# Patient Record
Sex: Male | Born: 2000 | Hispanic: No | Marital: Single | Smoking: Never smoker
Health system: Southern US, Community
[De-identification: ages and names within clinical notes are randomized; demographics above are authoritative.]

## PROBLEM LIST (undated history)

## (undated) DIAGNOSIS — J45909 Unspecified asthma, uncomplicated: Secondary | ICD-10-CM

---
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
## 2018-08-20 IMAGING — CR DG CHEST 2V
2 series · 2 of 2 positions shown · non-contrast
Comparison: None.

CLINICAL DATA: Acute onset of fever and productive cough. Initial
encounter.

EXAM:
CHEST  2 VIEW

[chest pa]
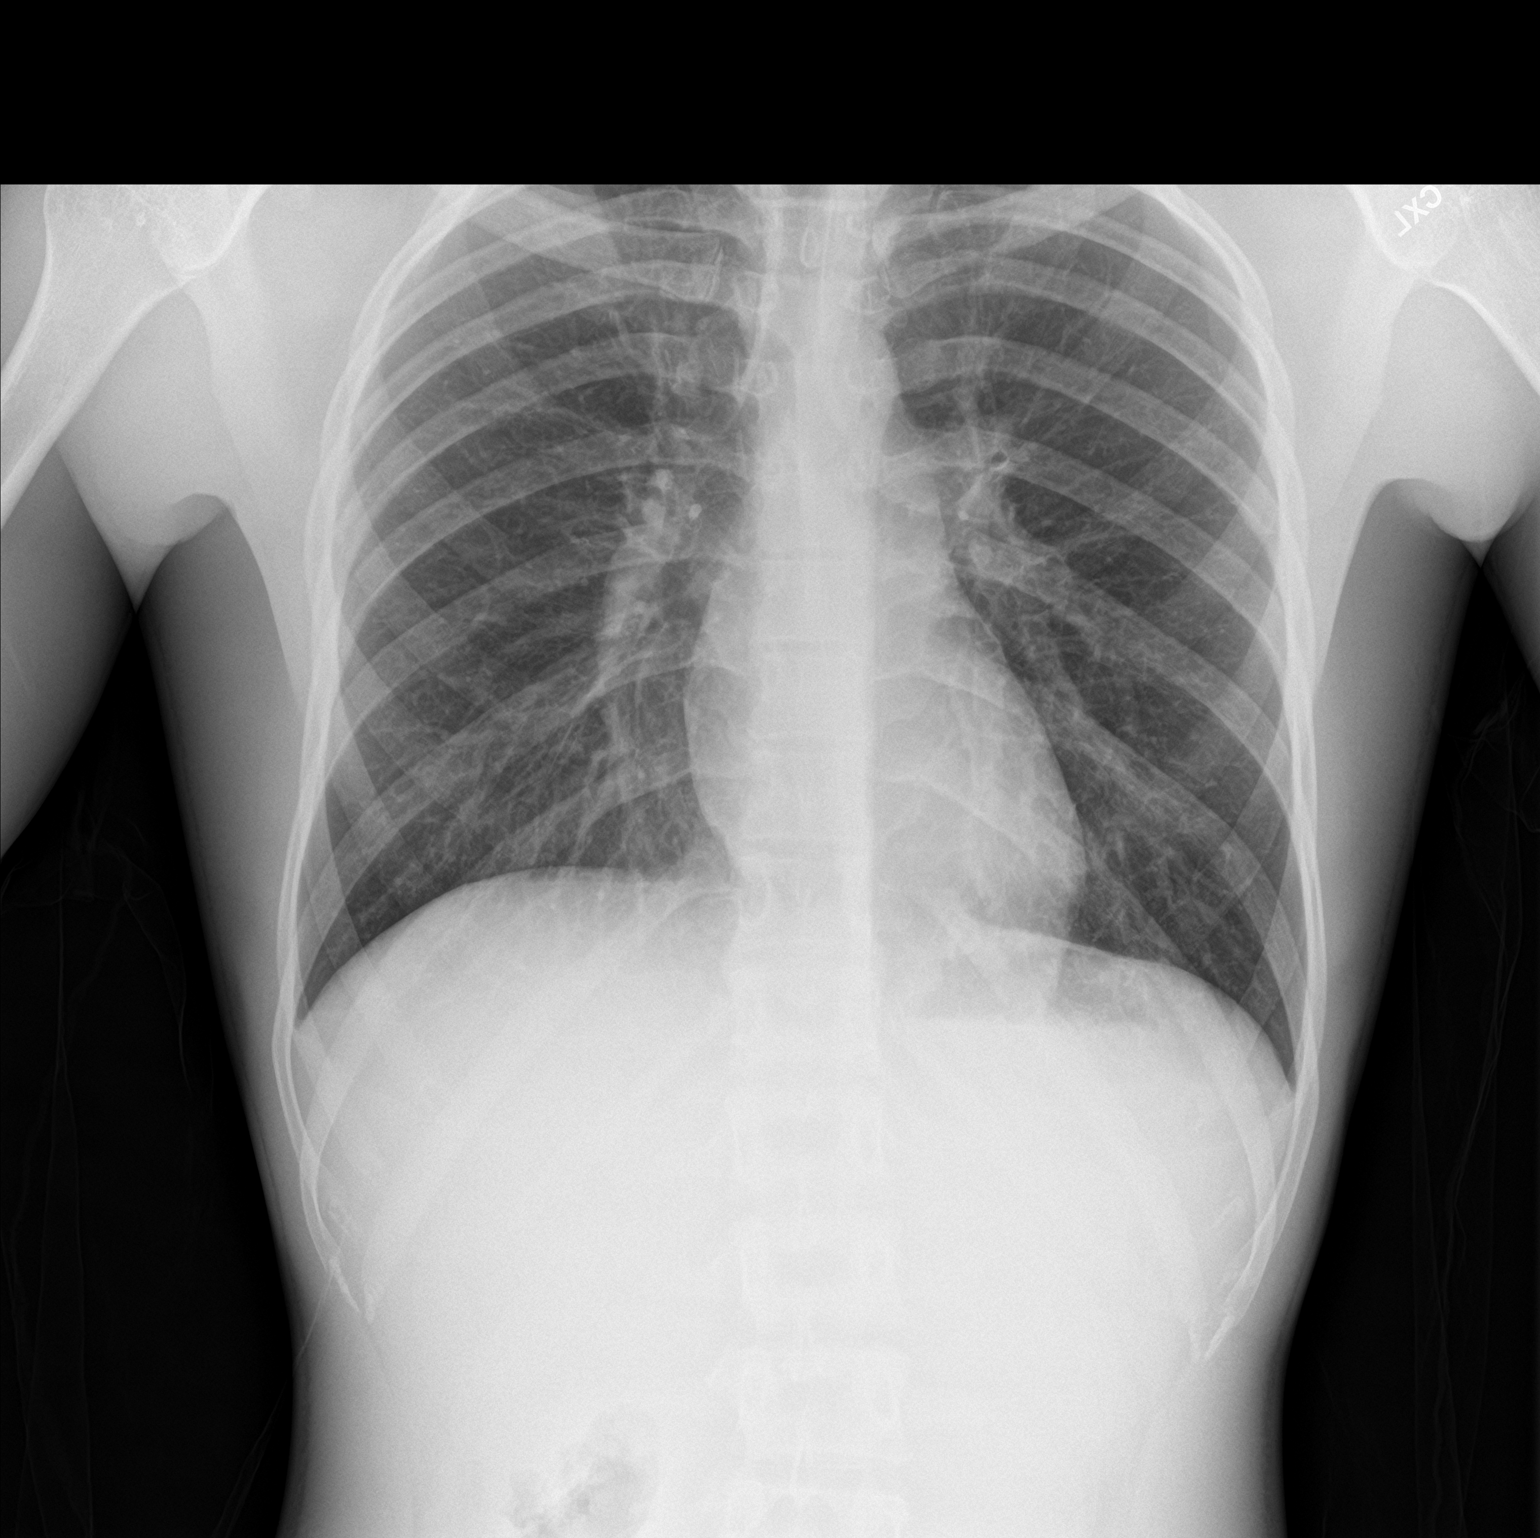

[chest lat]
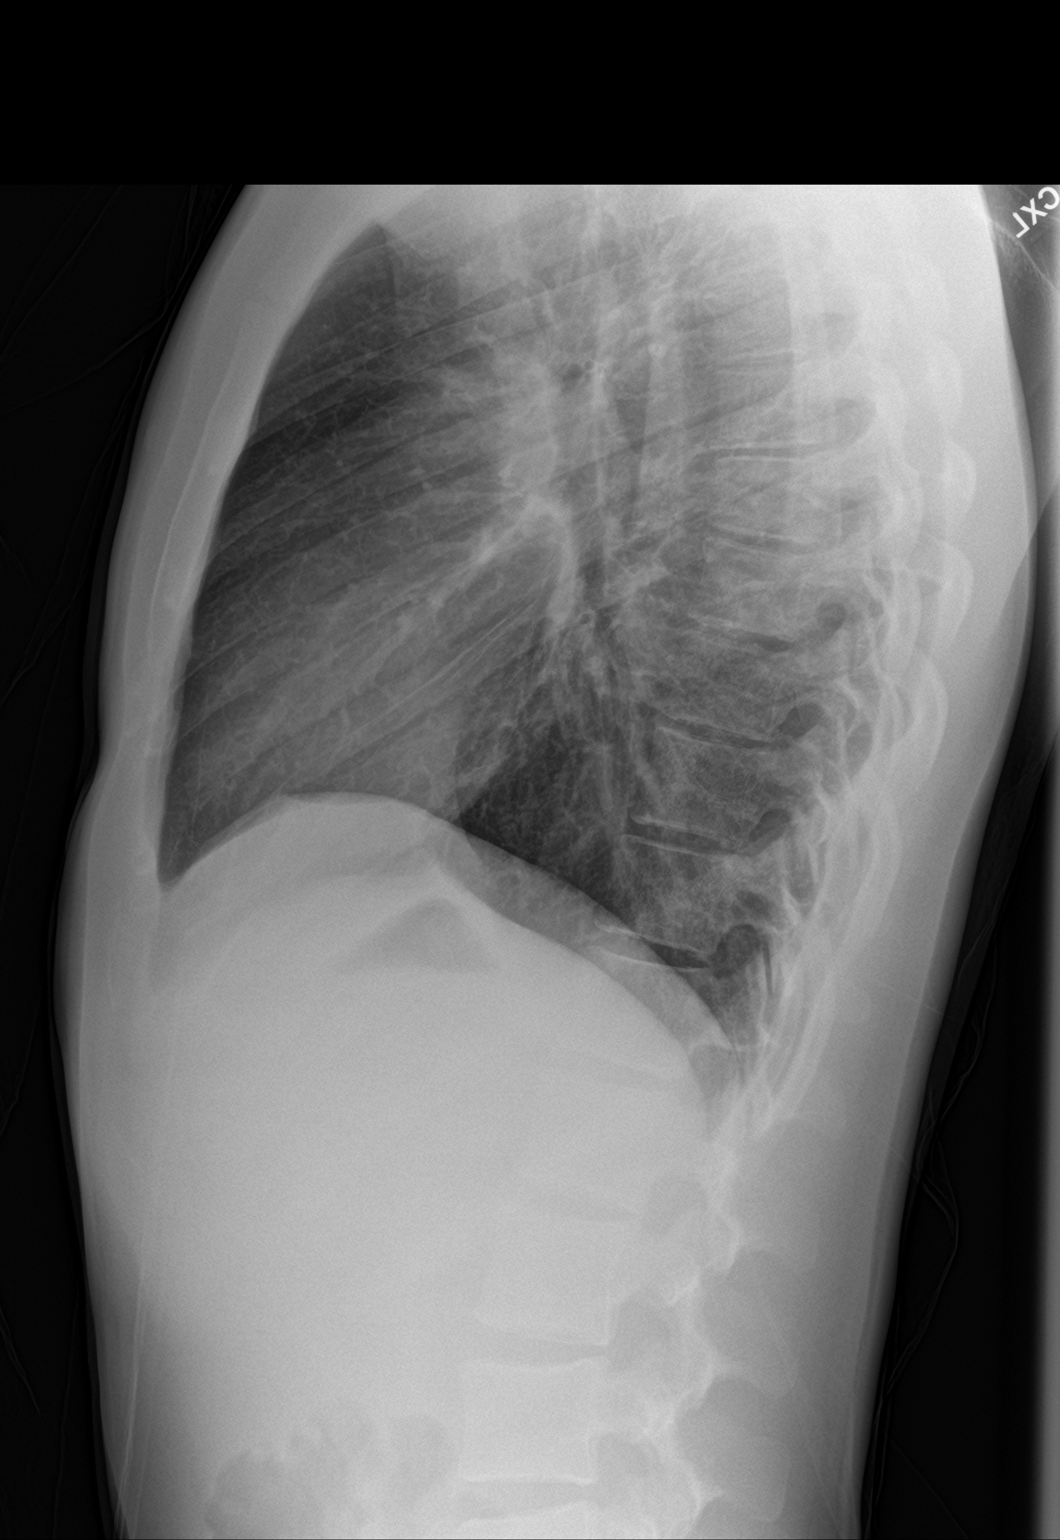

[2 of 2 positions shown; findings below may reference images not displayed]

FINDINGS: The lungs are well-aerated and clear. There is no evidence of focal
opacification, pleural effusion or pneumothorax.

The heart is normal in size; the mediastinal contour is within
normal limits. No acute osseous abnormalities are seen.
IMPRESSION: No acute cardiopulmonary process seen.

## 2022-09-14 ENCOUNTER — Ambulatory Visit (HOSPITAL_COMMUNITY)
Admission: EM | Admit: 2022-09-14 | Discharge: 2022-09-14 | Discharge status: Absent without leave | Payer: Medicaid Other

## 2022-09-15 ENCOUNTER — Encounter (HOSPITAL_COMMUNITY): Payer: Self-pay

## 2022-09-15 ENCOUNTER — Ambulatory Visit (HOSPITAL_COMMUNITY)
Admission: RE | Admit: 2022-09-15 | Discharge: 2022-09-15 | Disposition: A | Discharge status: Home / Self Care | Payer: Medicaid Other | Source: Ambulatory Visit | Attending: Emergency Medicine | Admitting: Emergency Medicine

## 2022-09-15 VITALS — BP 121/87 | HR 78 | Temp 98.0°F | Resp 12

## 2022-09-15 DIAGNOSIS — H9313 Tinnitus, bilateral: Secondary | ICD-10-CM

## 2022-09-15 DIAGNOSIS — H60503 Unspecified acute noninfective otitis externa, bilateral: Secondary | ICD-10-CM

## 2022-09-15 DIAGNOSIS — R198 Other specified symptoms and signs involving the digestive system and abdomen: Secondary | ICD-10-CM | POA: Diagnosis not present

## 2022-09-15 DIAGNOSIS — L299 Pruritus, unspecified: Secondary | ICD-10-CM | POA: Diagnosis not present

## 2022-09-15 MED ORDER — CIPROFLOXACIN-DEXAMETHASONE 0.3-0.1 % OT SUSP: 4.0000 [drp] | Freq: Two times a day (BID) | OTIC | 0 refills | Status: AC

## 2022-09-15 MED ORDER — LINACLOTIDE 72 MCG PO CAPS: 72.0000 ug | ORAL_CAPSULE | Freq: Every day | ORAL | 0 refills | Status: DC

## 2022-09-15 MED ORDER — CETIRIZINE HCL 10 MG PO TABS: 10.0000 mg | ORAL_TABLET | Freq: Every day | ORAL | 0 refills | Status: DC

## 2022-09-15 MED ORDER — PREDNISONE 20 MG PO TABS: 40.0000 mg | ORAL_TABLET | Freq: Every day | ORAL | 0 refills | Status: DC

## 2022-09-15 NOTE — ED Triage Notes (Signed)
Pt is skin has been irritated , ears has been popping

## 2022-09-15 NOTE — ED Provider Notes (Signed)
[facility]    CSN: [id number] Arrival date & time: 09/15/22  1047      History   Chief Complaint Chief Complaint  Patient presents with   Otalgia    HPI Jon Pham is a 22 y.o. male.  Patient presents complaining of generalized pruritus, bilateral ear discomfort, and straining with bowel movements that stared "months ago".  Patient reports the onset of symptoms began during him being incarcerated in jail, he states he is uncertain if exposure to " K2 drug smoke" in jail is part of the cause of his symptoms.  He reports that he was released from jail in November.  He states that he has random intermittent pruritus that is not relieved by anything and not exacerbated by anything.  He reports that he has used lotion for the pruritus with minimal relief of symptoms.  He reports that he has tried to use Q-tips to clean his ears with no relief of his ear discomfort.  He reports that he has had abnormal sounds in his ears, states that he will have " sounds of drops that are constant".  He reports that he is uncertain if it is due to his incarceration time in prison that he has continued issues with his ears.  He denies any trauma to his ears, ear drainage, fever, chills, nasal congestion, throat pain or any systemic symptoms.  He reports that he has had straining with bowel movements.  He reports that he believes that this was because due to the bread that he ate when he was in jail.  He reports that since leaving jail he has maintained a diverse diet, he states that he feels like since the food in jail was " non-halal for my religion" it may has caused the straining in his bowel movement. He reports that his last BM was today.  Denies history of gastrointestinal problems.   He appears anxious upon exam. Patient reports he's had some ongoing anxiety since leaving jail.   He states he's concerned about getting all his symptoms treated today.   History of Asthma and states he no  longer uses an inhaler for asthma.    Otalgia Associated symptoms: no abdominal pain, no congestion, no diarrhea, no ear discharge, no fever, no hearing loss, no rhinorrhea, no sore throat, no tinnitus and no vomiting     Past Medical History:  Diagnosis Date   Asthma     There are no problems to display for this patient.   History reviewed. No pertinent surgical history.     Home Medications    Prior to Admission medications   Medication Sig Start Date End Date Taking? Authorizing Provider  cetirizine (ZYRTEC ALLERGY) 10 MG tablet Take 1 tablet (10 mg total) by mouth daily. 09/15/22  Yes Debby Freiberg, NP  ciprofloxacin-dexamethasone (CIPRODEX) OTIC suspension Place 4 drops into both ears 2 (two) times daily for 5 days. 09/15/22 09/20/22 Yes Debby Freiberg, NP  linaclotide Karlene Einstein) 72 MCG capsule Take 1 capsule (72 mcg total) by mouth daily before breakfast. 09/15/22  Yes Debby Freiberg, NP  predniSONE (DELTASONE) 20 MG tablet Take 2 tablets (40 mg total) by mouth daily. 09/15/22  Yes Debby Freiberg, NP    Family History Family History  Problem Relation Age of Onset   Cancer Other     Social History Social History   Tobacco Use   Smoking status: Never   Smokeless tobacco: Never  Substance Use Topics   Alcohol  use: No   Drug use: No     Allergies   Patient has no known allergies.   Review of Systems Review of Systems  Constitutional:  Negative for activity change, appetite change, chills, fatigue, fever and unexpected weight change.  HENT:  Positive for ear pain. Negative for congestion, dental problem, ear discharge, hearing loss, nosebleeds, postnasal drip, rhinorrhea, sinus pressure, sinus pain, sneezing, sore throat, tinnitus, trouble swallowing and voice change.   Eyes: Negative.   Respiratory: Negative.  Negative for shortness of breath and wheezing.   Cardiovascular: Negative.  Negative for chest pain and palpitations.   Gastrointestinal:  Positive for constipation. Negative for abdominal distention, abdominal pain, blood in stool, diarrhea, nausea and vomiting.     Physical Exam Triage Vital Signs ED Triage Vitals  Enc Vitals Group     BP 09/15/22 1124 121/87     Pulse Rate 09/15/22 1124 78     Resp 09/15/22 1124 12     Temp 09/15/22 1124 98 F (36.7 C)     Temp Source 09/15/22 1124 Oral     SpO2 09/15/22 1124 96 %     Weight --      Height --      Head Circumference --      Peak Flow --      Pain Score 09/15/22 1131 6     Pain Loc --      Pain Edu? --      Excl. in [place]? --    No data found.  Updated Vital Signs BP 121/87 (BP Location: Left Arm)   Pulse 78   Temp 98 F (36.7 C) (Oral)   Resp 12   SpO2 96%     Physical Exam Vitals and nursing note reviewed.  HENT:     Right Ear: Hearing, tympanic membrane and external ear normal. Swelling (within ear canal) present.     Left Ear: Hearing, tympanic membrane and external ear normal. Swelling (within ear canal) present.  Cardiovascular:     Rate and Rhythm: Normal rate and regular rhythm.     Heart sounds: Normal heart sounds, S1 normal and S2 normal.  Pulmonary:     Effort: Pulmonary effort is normal.     Breath sounds: Normal breath sounds and air entry. No decreased breath sounds, wheezing, rhonchi or rales.  Abdominal:     General: Abdomen is flat. Bowel sounds are normal.     Palpations: Abdomen is soft.     Tenderness: There is abdominal tenderness. There is no right CVA tenderness, left CVA tenderness, guarding or rebound. Negative signs include Murphy's sign and McBurney's sign.  Skin:    General: Skin is warm.     Capillary Refill: Capillary refill takes less than 2 seconds.     Findings: No bruising, ecchymosis, erythema or rash.     Comments: Skin on unclothed areas appear normal.   Neurological:     Mental Status: He is alert.  Psychiatric:        Attention and Perception: Attention and perception normal.         Mood and Affect: Mood is anxious. Affect is labile.        Speech: Speech normal.        Behavior: Behavior normal. Behavior is cooperative.        Thought Content: Thought content normal. Thought content does not include homicidal or suicidal ideation.        Cognition and Memory: Cognition normal.  Judgment: Judgment normal.      UC Treatments / Results  Labs (all labs ordered are listed, but only abnormal results are displayed) Labs Reviewed - No data to display  EKG   Radiology No results found.  Procedures Procedures (including critical care time)  Medications Ordered in UC Medications - No data to display  Initial Impression / Assessment and Plan / UC Course  I have reviewed the triage vital signs and the nursing notes.  Pertinent labs & imaging results that were available during my care of the patient were reviewed by me and considered in my medical decision making (see chart for details).     Patient was evaluated for pruritus.  Uncertain etiology of symptoms.  Prednisone and Zyrtec sent to the pharmacy, patient was educated on treatment regiment.  Patient was made aware of timeline for symptom resolution.   Patient was evaluated acute otitis externa of and ear noise of both ears.  Ciprodex eardrops was sent to the pharmacy.  Patient was made aware of medication regiment.  Patient was made aware that he will need to follow-up with his PCP if symptoms do not improve.  Patient was evaluated for straining during bowel movements.  Based on physical assessment and patient statement, no concern for any emergent gastrointestinal problem.  Patient was educated on increasing fluid intake and maintaining a high-fiber diet.  Linzess prescription was sent to the pharmacy, patient was made aware of treatment regiment.  Patient was made aware that he will need to follow-up with his PCP.   Based on physical assessment, patient appears to have a potential psych component to his  symptomology.  Patient was given information for the full [facility]  Patient was encouraged to follow-up with this clinic if anxiety symptoms are not improving.  Patient verbalized understanding of instructions.  Charting was provided using a a verbal dictation system, charting was proofread for errors, errors may occur which could change the meaning of the information charted.   Final Clinical Impressions(s) / UC Diagnoses   Final diagnoses:  Pruritus  Acute otitis externa of both ears, unspecified type  Straining during bowel movements     Discharge Instructions      Prednisone is a steroid, this medication has been sent to the pharmacy, you will take 2 tablets for the next 5 mornings, this medication is for the itching.  Ciprodex ear drops has been sent to the pharmacy, will place 2 drops in each ear 2 times daily for the next 5 days, this is for the ear discomfort.  Zyrtec has been sent to the pharmacy, this is an oral antihistamine, take 1 tablet daily, this medication can help with the itching and possibly with the ear discomfort. Linzess has been sent to the pharmacy, you will take 1 tablet at breakfast time, this medication is to help your bowel movements be regular.  I have attached information for your primary care provider, you will need to call and schedule an appointment for follow-up.  I have also attached information for behavioral health urgent care, you may follow-up with this clinic if you feel like you continue to have anxiety issues or feel as though you want a psych evaluation.  If you develop any severe/worsening symptoms please go to the nearest emergency department for further evaluation.       ED Prescriptions     Medication Sig Dispense Auth. Provider   predniSONE (DELTASONE) 20 MG tablet Take 2 tablets (40 mg total) by  mouth daily. 10 tablet Debby Freiberg, NP   ciprofloxacin-dexamethasone (CIPRODEX) OTIC suspension Place 4  drops into both ears 2 (two) times daily for 5 days. 7.5 mL Debby Freiberg, NP   linaclotide (LINZESS) 72 MCG capsule Take 1 capsule (72 mcg total) by mouth daily before breakfast. 15 capsule Debby Freiberg, NP   cetirizine (ZYRTEC ALLERGY) 10 MG tablet Take 1 tablet (10 mg total) by mouth daily. 30 tablet Debby Freiberg, NP      PDMP not reviewed this encounter.   Debby Freiberg, NP 09/15/22 1253

## 2022-09-15 NOTE — Discharge Instructions (Addendum)
Prednisone is a steroid, this medication has been sent to the pharmacy, you will take 2 tablets for the next 5 mornings, this medication is for the itching.  Ciprodex ear drops has been sent to the pharmacy, will place 2 drops in each ear 2 times daily for the next 5 days, this is for the ear discomfort.  Zyrtec has been sent to the pharmacy, this is an oral antihistamine, take 1 tablet daily, this medication can help with the itching and possibly with the ear discomfort. Linzess has been sent to the pharmacy, you will take 1 tablet at breakfast time, this medication is to help your bowel movements be regular.  I have attached information for your primary care provider, you will need to call and schedule an appointment for follow-up.  I have also attached information for behavioral health urgent care, you may follow-up with this clinic if you feel like you continue to have anxiety issues or feel as though you want a psych evaluation.  If you develop any severe/worsening symptoms please go to the nearest emergency department for further evaluation.

## 2022-12-10 ENCOUNTER — Encounter (HOSPITAL_COMMUNITY): Payer: Self-pay | Admitting: Emergency Medicine

## 2022-12-10 ENCOUNTER — Ambulatory Visit (HOSPITAL_COMMUNITY)
Admission: EM | Admit: 2022-12-10 | Discharge: 2022-12-10 | Disposition: A | Discharge status: Home / Self Care | Payer: Medicaid Other | Attending: Family Medicine | Admitting: Family Medicine

## 2022-12-10 DIAGNOSIS — Z79899 Other long term (current) drug therapy: Secondary | ICD-10-CM | POA: Insufficient documentation

## 2022-12-10 DIAGNOSIS — Z1152 Encounter for screening for COVID-19: Secondary | ICD-10-CM | POA: Diagnosis not present

## 2022-12-10 DIAGNOSIS — J4521 Mild intermittent asthma with (acute) exacerbation: Secondary | ICD-10-CM | POA: Diagnosis present

## 2022-12-10 DIAGNOSIS — J069 Acute upper respiratory infection, unspecified: Secondary | ICD-10-CM | POA: Insufficient documentation

## 2022-12-10 DIAGNOSIS — J301 Allergic rhinitis due to pollen: Secondary | ICD-10-CM | POA: Diagnosis not present

## 2022-12-10 MED ORDER — PREDNISONE 20 MG PO TABS: 40.0000 mg | ORAL_TABLET | Freq: Every day | ORAL | 0 refills | Status: AC

## 2022-12-10 MED ORDER — PROMETHAZINE-DM 6.25-15 MG/5ML PO SYRP: 5.0000 mL | ORAL_SOLUTION | Freq: Four times a day (QID) | ORAL | 0 refills | Status: DC | PRN

## 2022-12-10 MED ORDER — ALBUTEROL SULFATE HFA 108 (90 BASE) MCG/ACT IN AERS: 2.0000 | INHALATION_SPRAY | RESPIRATORY_TRACT | 0 refills | Status: AC | PRN

## 2022-12-10 NOTE — ED Provider Notes (Signed)
[facility]    CSN: [id number] Arrival date & time: 12/10/22  1346      History   Chief Complaint Chief Complaint  Patient presents with   Nasal Congestion    HPI Jon Pham is a 22 y.o. male.   HPI Here for nasal congestion and cough.  He has had nasal congestion and postnasal drainage for the last 2 weeks, and he is felt for the most part that this was consistent with allergies.  On April 19 however he began feeling worse with more intense congestion and his throat is little sore from the postnasal drainage.  Also he has had some malaise and fatigue.  No vomiting or diarrhea.   he has developed some persistent cough though it is improved in the last 24 hours.  His may be feeling a little bit better today also he does have a history of asthma and when he went running yesterday he had some wheezing and tightness in his chest.   He requests Hydromet for his cough.  Past Medical History:  Diagnosis Date   Asthma     There are no problems to display for this patient.   History reviewed. No pertinent surgical history.     Home Medications    Prior to Admission medications   Medication Sig Start Date End Date Taking? Authorizing Provider  albuterol (VENTOLIN HFA) 108 (90 Base) MCG/ACT inhaler Inhale 2 puffs into the lungs every 4 (four) hours as needed for wheezing or shortness of breath. 12/10/22  Yes Zenia Resides, MD  predniSONE (DELTASONE) 20 MG tablet Take 2 tablets (40 mg total) by mouth daily with breakfast for 5 days. 12/10/22 12/15/22 Yes Zenia Resides, MD  promethazine-dextromethorphan (PROMETHAZINE-DM) 6.25-15 MG/5ML syrup Take 5 mLs by mouth 4 (four) times daily as needed for cough. 12/10/22  Yes Zenia Resides, MD    Family History Family History  Problem Relation Age of Onset   Cancer Other     Social History Social History   Tobacco Use   Smoking status: Never   Smokeless tobacco: Never  Substance Use Topics   Alcohol use:  No   Drug use: No     Allergies   Patient has no known allergies.   Review of Systems Review of Systems   Physical Exam Triage Vital Signs ED Triage Vitals  Enc Vitals Group     BP 12/10/22 1504 126/82     Pulse Rate 12/10/22 1504 94     Resp 12/10/22 1504 17     Temp 12/10/22 1504 98.2 F (36.8 C)     Temp Source 12/10/22 1504 Oral     SpO2 12/10/22 1504 97 %     Weight --      Height --      Head Circumference --      Peak Flow --      Pain Score 12/10/22 1503 0     Pain Loc --      Pain Edu? --      Excl. in [place]? --    No data found.  Updated Vital Signs BP 126/82 (BP Location: Left Arm)   Pulse 94   Temp 98.2 F (36.8 C) (Oral)   Resp 17   SpO2 97%   Visual Acuity Right Eye Distance:   Left Eye Distance:   Bilateral Distance:    Right Eye Near:   Left Eye Near:    Bilateral Near:     Physical Exam  Vitals reviewed.  Constitutional:      General: He is not in acute distress.    Appearance: He is not toxic-appearing.  HENT:     Right Ear: Tympanic membrane and ear canal normal.     Left Ear: Tympanic membrane and ear canal normal.     Nose: Congestion present.     Mouth/Throat:     Mouth: Mucous membranes are moist.     Comments: There is clear mucus draining in the oropharynx.  No erythema. Eyes:     Extraocular Movements: Extraocular movements intact.     Conjunctiva/sclera: Conjunctivae normal.     Pupils: Pupils are equal, round, and reactive to light.  Cardiovascular:     Rate and Rhythm: Normal rate and regular rhythm.     Heart sounds: No murmur heard. Pulmonary:     Effort: No respiratory distress.     Breath sounds: No stridor. No wheezing, rhonchi or rales.  Musculoskeletal:     Cervical back: Neck supple.  Lymphadenopathy:     Cervical: No cervical adenopathy.  Skin:    Capillary Refill: Capillary refill takes less than 2 seconds.     Coloration: Skin is not jaundiced or pale.  Neurological:     General: No focal deficit  present.     Mental Status: He is alert and oriented to person, place, and time.  Psychiatric:        Behavior: Behavior normal.      UC Treatments / Results  Labs (all labs ordered are listed, but only abnormal results are displayed) Labs Reviewed  SARS CORONAVIRUS 2 (TAT 6-24 HRS)    EKG   Radiology No results found.  Procedures Procedures (including critical care time)  Medications Ordered in UC Medications - No data to display  Initial Impression / Assessment and Plan / UC Course  I have reviewed the triage vital signs and the nursing notes.  Pertinent labs & imaging results that were available during my care of the patient were reviewed by me and considered in my medical decision making (see chart for details).        Albuterol and prednisone are sent in for the asthma exacerbation.  Discussed with him that Hydromet is not a cough syrup I usually use unless someone is not improving or if they have such a thing as asthma.  I was going to send in [facility], but he still request a "good" cough syrup.  Phenergan with dextromethorphan is therefore sent.  COVID swab was done.  If positive he will know if he needs to quarantine any further Final Clinical Impressions(s) / UC Diagnoses   Final diagnoses:  Mild intermittent asthma with acute exacerbation  Viral URI with cough  Seasonal allergic rhinitis due to pollen     Discharge Instructions      Albuterol inhaler--do 2 puffs every 4 hours as needed for shortness of breath or wheezing  Take prednisone 20 mg--2 daily for 5 days  Take Phenergan with dextromethorphan syrup--5 mL or 1 teaspoon every 6 hours as needed for cough   You have been swabbed for COVID, and the test will result in the next 24 hours. Our staff will call you if positive. If the COVID test is positive, you should quarantine until you are fever free for 24 hours and you are starting to feel better, and then take added precautions for the  next 5 days, such as physical distancing/wearing a mask and good hand hygiene/washing.  ED Prescriptions     Medication Sig Dispense Auth. Provider   albuterol (VENTOLIN HFA) 108 (90 Base) MCG/ACT inhaler Inhale 2 puffs into the lungs every 4 (four) hours as needed for wheezing or shortness of breath. 1 each Zenia Resides, MD   predniSONE (DELTASONE) 20 MG tablet Take 2 tablets (40 mg total) by mouth daily with breakfast for 5 days. 10 tablet Zenia Resides, MD   promethazine-dextromethorphan (PROMETHAZINE-DM) 6.25-15 MG/5ML syrup Take 5 mLs by mouth 4 (four) times daily as needed for cough. 118 mL Zenia Resides, MD      PDMP not reviewed this encounter.   Zenia Resides, MD 12/10/22 [phone]

## 2022-12-10 NOTE — ED Triage Notes (Signed)
Pt reports bad congestion due to allergies since Friday. Reports tried Theraflu and hot tea.

## 2022-12-10 NOTE — Discharge Instructions (Signed)
Albuterol inhaler--do 2 puffs every 4 hours as needed for shortness of breath or wheezing  Take prednisone 20 mg--2 daily for 5 days  Take Phenergan with dextromethorphan syrup--5 mL or 1 teaspoon every 6 hours as needed for cough   You have been swabbed for COVID, and the test will result in the next 24 hours. Our staff will call you if positive. If the COVID test is positive, you should quarantine until you are fever free for 24 hours and you are starting to feel better, and then take added precautions for the next 5 days, such as physical distancing/wearing a mask and good hand hygiene/washing.

## 2022-12-11 LAB — SARS CORONAVIRUS 2 (TAT 6-24 HRS): SARS Coronavirus 2: NEGATIVE

## 2023-01-04 ENCOUNTER — Emergency Department (HOSPITAL_COMMUNITY): Payer: Medicaid Other

## 2023-01-04 ENCOUNTER — Encounter (HOSPITAL_COMMUNITY): Payer: Self-pay

## 2023-01-04 ENCOUNTER — Other Ambulatory Visit: Payer: Self-pay

## 2023-01-04 ENCOUNTER — Emergency Department (HOSPITAL_COMMUNITY)
Admission: EM | Admit: 2023-01-04 | Discharge: 2023-01-04 | Disposition: A | Discharge status: Home / Self Care | Payer: Medicaid Other | Attending: Emergency Medicine | Admitting: Emergency Medicine

## 2023-01-04 ENCOUNTER — Ambulatory Visit (HOSPITAL_COMMUNITY)
Admission: EM | Admit: 2023-01-04 | Discharge: 2023-01-04 | Disposition: A | Discharge status: Home / Self Care | Payer: No Typology Code available for payment source

## 2023-01-04 DIAGNOSIS — S0990XA Unspecified injury of head, initial encounter: Secondary | ICD-10-CM

## 2023-01-04 DIAGNOSIS — S0101XA Laceration without foreign body of scalp, initial encounter: Secondary | ICD-10-CM

## 2023-01-04 DIAGNOSIS — W228XXA Striking against or struck by other objects, initial encounter: Secondary | ICD-10-CM | POA: Insufficient documentation

## 2023-01-04 MED ORDER — LIDOCAINE-EPINEPHRINE-TETRACAINE (LET) TOPICAL GEL
3.0000 mL | Freq: Once | TOPICAL | Status: DC
  Administered 2023-01-04: 3 mL via TOPICAL
  Filled 2023-01-04: qty 3

## 2023-01-04 MED ORDER — LIDOCAINE-EPINEPHRINE (PF) 2 %-1:200000 IJ SOLN
10.0000 mL | Freq: Once | INTRAMUSCULAR | Status: AC
  Administered 2023-01-04: 10 mL
  Filled 2023-01-04: qty 20

## 2023-01-04 NOTE — Discharge Instructions (Signed)
Note the workup today was overall reassuring.  CT study was without any abnormality.  Symptoms is likely secondary to concussive type episode.  Lacerations were repaired using staples.  You have 4 in place.  They need to be removed in 7 to 10 days at urgent care, primary care or in the emergency department.  Take Tylenol/Motrin as needed for pain.  Wash area gently with warm soapy water.  Avoid submersion in lakes, pools, baths, ocean.  Please not hesitate to return to emergency department for worrisome signs and symptoms we discussed become apparent.

## 2023-01-04 NOTE — ED Provider Notes (Signed)
[facility]    CSN: [id number] Arrival date & time: 01/04/23  1555      History   Chief Complaint Chief Complaint  Patient presents with   Head Injury    laceration    HPI Jon Pham is a 22 y.o. male.   Pleasant 22 year old male presents today due to concern of head injury.  He states roughly 3 hours ago he was running up stairs training, and somehow slipped hitting his head on 2 bleachers and a concrete stair.  Since the head injury, he states he has developed a slowed reaction time and feeling difficulty concentrating or remembering.  He also feels slightly emotional.  He denied losing any consciousness, and denies any nausea or vomiting.  He does not have a fever.  Initially he stated headache only to the location of the lacerations, but then states he is starting to develop a more severe headache which is also now including his frontal lobe. He sustained two lacerations to the R occipital region of his scalp. Pt does feel off balance and dizzy.    Head Injury   Past Medical History:  Diagnosis Date   Asthma     There are no problems to display for this patient.   History reviewed. No pertinent surgical history.     Home Medications    Prior to Admission medications   Medication Sig Start Date End Date Taking? Authorizing Provider  albuterol (VENTOLIN HFA) 108 (90 Base) MCG/ACT inhaler Inhale 2 puffs into the lungs every 4 (four) hours as needed for wheezing or shortness of breath. 12/10/22   Zenia Resides, MD    Family History Family History  Problem Relation Age of Onset   Cancer Other     Social History Social History   Tobacco Use   Smoking status: Never   Smokeless tobacco: Never  Substance Use Topics   Alcohol use: No   Drug use: No     Allergies   Patient has no known allergies.   Review of Systems Review of Systems As per HPI  Physical Exam Triage Vital Signs ED Triage Vitals  Enc Vitals Group     BP 01/04/23  1609 130/72     Pulse Rate 01/04/23 1609 89     Resp 01/04/23 1609 16     Temp 01/04/23 1609 98.3 F (36.8 C)     Temp Source 01/04/23 1609 Oral     SpO2 01/04/23 1609 97 %     Weight 01/04/23 1608 180 lb (81.6 kg)     Height 01/04/23 1608 5\' 9"  (1.753 m)     Head Circumference --      Peak Flow --      Pain Score 01/04/23 1609 3     Pain Loc --      Pain Edu? --      Excl. in GC? --    No data found.  Updated Vital Signs BP 130/72 (BP Location: Left Arm)   Pulse 89   Temp 98.3 F (36.8 C) (Oral)   Resp 16   Ht 5\' 9"  (1.753 m)   Wt 180 lb (81.6 kg)   SpO2 97%   BMI 26.58 kg/m   Visual Acuity Right Eye Distance:   Left Eye Distance:   Bilateral Distance:    Right Eye Near:   Left Eye Near:    Bilateral Near:     Physical Exam Vitals and nursing note reviewed.  Constitutional:  General: He is not in acute distress.    Appearance: Normal appearance. He is normal weight. He is not ill-appearing or toxic-appearing.  HENT:     Head: Normocephalic. Mass (hematoma surrounding lacs) and laceration present. No Battle's sign.      Right Ear: Tympanic membrane, ear canal and external ear normal. There is no impacted cerumen. No hemotympanum. Tympanic membrane is not injected, erythematous or retracted.     Left Ear: Tympanic membrane, ear canal and external ear normal. There is no impacted cerumen. No hemotympanum. Tympanic membrane is not injected, erythematous or retracted.     Nose: Nose normal. No congestion or rhinorrhea.     Mouth/Throat:     Mouth: Mucous membranes are moist.     Pharynx: Oropharynx is clear. No oropharyngeal exudate or posterior oropharyngeal erythema.  Eyes:     General: No visual field deficit or scleral icterus.       Right eye: No discharge.        Left eye: No discharge.     Extraocular Movements: Extraocular movements intact.     Conjunctiva/sclera: Conjunctivae normal.     Pupils: Pupils are equal, round, and reactive to light.   Cardiovascular:     Rate and Rhythm: Normal rate and regular rhythm.     Pulses: Normal pulses.     Heart sounds: No murmur heard. Pulmonary:     Effort: Pulmonary effort is normal. No respiratory distress.     Breath sounds: Normal breath sounds. No stridor. No rhonchi.  Chest:     Chest wall: No tenderness.  Musculoskeletal:        General: No swelling, tenderness, deformity or signs of injury. Normal range of motion.     Cervical back: Normal range of motion and neck supple. No rigidity or tenderness.     Right lower leg: No edema.  Lymphadenopathy:     Cervical: No cervical adenopathy.  Skin:    General: Skin is warm.     Coloration: Skin is not jaundiced.     Findings: No bruising, erythema, lesion or rash.  Neurological:     General: No focal deficit present.     Mental Status: He is alert and oriented to person, place, and time. Mental status is at baseline.     Cranial Nerves: Cranial nerves 2-12 are intact. No cranial nerve deficit, dysarthria or facial asymmetry.     Sensory: Sensation is intact. No sensory deficit.     Motor: Motor function is intact. No weakness, tremor, atrophy, abnormal muscle tone or pronator drift.     Coordination: Coordination is intact. Coordination normal.     Gait: Gait is intact.     Deep Tendon Reflexes: Reflexes are normal and symmetric. Reflexes normal.  Psychiatric:        Mood and Affect: Mood normal.        Behavior: Behavior normal.        Thought Content: Thought content normal.        Judgment: Judgment normal.      UC Treatments / Results  Labs (all labs ordered are listed, but only abnormal results are displayed) Labs Reviewed - No data to display  EKG   Radiology No results found.  Procedures Procedures (including critical care time)  Medications Ordered in UC Medications - No data to display  Initial Impression / Assessment and Plan / UC Course  I have reviewed the triage vital signs and the nursing  notes.  Pertinent labs & imaging  results that were available during my care of the patient were reviewed by me and considered in my medical decision making (see chart for details).     Injury to head -patient with head injury, and worsening headache upon evaluation.  Vital signs stable and neuroexam normal in office, however with worsening symptoms patient wanting to obtain CT scan.  Discussed with patient this would have to be done through the ER as we do not have capabilities of ordering stat CTs from urgent care.  Patient requesting to present to ER for further evaluation. Laceration of scalp without foreign body -2 scalp lacerations to right occipital region, will likely need staples.  Not performed in urgent care as to avoid any interference with possible imaging done in ER.    Final Clinical Impressions(s) / UC Diagnoses   Final diagnoses:  Injury of head, initial encounter  Laceration of scalp without foreign body, initial encounter     Discharge Instructions      Pt with head injury 3-3.5 hours ago. Two lacerations noted to occipital region of scalp on L. Pt filled out head injury form scoring: Moderate headache, mild balance problems, dizziness and fatigue, drowsiness, sensitivity to light and noise, nervousness and feeling more emotional.  Patient also notes numbness and tingling.  Since the injury patient reports moderate difficulty concentrating remembering and feeling slowed down. During our discussion, his headache became more severe, involving the entire head and not just the location of the laceration. Due to this, I feel CT scan may be indicated. Lacerations were not closed to ensure no interference with results of scan. Suspect after scan, staple closure will need to be obtained.  Pt to head to ER now for further evaluation of closed head injury.     ED Prescriptions   None    PDMP not reviewed this encounter.   Maretta Bees, [place] 01/04/23 1708

## 2023-01-04 NOTE — ED Notes (Signed)
Patient is being discharged from the Urgent Care and sent to the Emergency Department via self . Per Guy Sandifer, PA, patient is in need of higher level of care due to Head injury. Patient is aware and verbalizes understanding of plan of care.  Vitals:   01/04/23 1609  BP: 130/72  Pulse: 89  Resp: 16  Temp: 98.3 F (36.8 C)  SpO2: 97%

## 2023-01-04 NOTE — ED Provider Notes (Signed)
[place] EMERGENCY DEPARTMENT AT [facility] Provider Note   CSN: [id number] Arrival date & time: 01/04/23  1721     History  Chief Complaint  Patient presents with   Head Injury    Jon Pham is a 22 y.o. male.   Head Injury   22 year old male presents emergency department with complaints of head injury.  Patient states that he was working out earlier today and hit the back of his head on the bleachers.  Patient states that he suffered 2 cuts on the back of his head.  Denies loss of consciousness or blood thinner use states that he had headache since then.  Denies any visual disturbance, gait abnormality, slurred speech, facial droop, weakness/sensory deficit of the lower extremities.  Patient was seen in urgent care and sent to the emergency department for CT scan of the head.  Denies trauma elsewhere.  No significant pertinent past medical history.  Home Medications Prior to Admission medications   Medication Sig Start Date End Date Taking? Authorizing Provider  albuterol (VENTOLIN HFA) 108 (90 Base) MCG/ACT inhaler Inhale 2 puffs into the lungs every 4 (four) hours as needed for wheezing or shortness of breath. 12/10/22   Zenia Resides, MD      Allergies    Patient has no known allergies.    Review of Systems   Review of Systems  All other systems reviewed and are negative.   Physical Exam Updated Vital Signs BP 132/80   Pulse 90   Temp 98.8 F (37.1 C) (Oral)   Resp 18   Wt 81.6 kg   SpO2 100%   BMI 26.58 kg/m  Physical Exam Vitals and nursing note reviewed.  Constitutional:      General: He is not in acute distress.    Appearance: He is well-developed.  HENT:     Head: Normocephalic.     Comments: 2 linear lacerations noted on posterior scalp without obvious foreign body or active bleeding appreciated.  1 measuring 2 cm and the other 1 1.5 cm. Eyes:     Conjunctiva/sclera: Conjunctivae normal.  Cardiovascular:     Rate and Rhythm:  Normal rate and regular rhythm.     Heart sounds: No murmur heard. Pulmonary:     Effort: Pulmonary effort is normal. No respiratory distress.     Breath sounds: Normal breath sounds.  Abdominal:     Palpations: Abdomen is soft.     Tenderness: There is no abdominal tenderness.  Musculoskeletal:        General: No swelling.     Cervical back: Neck supple.  Skin:    General: Skin is warm and dry.     Capillary Refill: Capillary refill takes less than 2 seconds.  Neurological:     Mental Status: He is alert.     Comments: Alert and oriented to self, place, time and event.   Speech is fluent, clear without dysarthria or dysphasia.   Strength 5/5 in upper/lower extremities   Sensation intact in upper/lower extremities   Normal gait.  Negative Romberg. No pronator drift.  Normal finger-to-nose and feet tapping.  CN I not tested  CN II not tested CN III, IV, VI PERRLA and EOMs intact bilaterally  CN V Intact sensation to sharp and light touch to the face  CN VII facial movements symmetric  CN VIII not tested  CN IX, X no uvula deviation, symmetric rise of soft palate  CN XI 5/5 SCM and trapezius strength bilaterally  CN XII Midline tongue protrusion, symmetric L/R movements     Psychiatric:        Mood and Affect: Mood normal.     ED Results / Procedures / Treatments   Labs (all labs ordered are listed, but only abnormal results are displayed) Labs Reviewed - No data to display  EKG None  Radiology CT Head Wo Contrast  Result Date: 01/04/2023 CLINICAL DATA:  Moderate to severe head trauma. EXAM: CT HEAD WITHOUT CONTRAST TECHNIQUE: Contiguous axial images were obtained from the base of the skull through the vertex without intravenous contrast. RADIATION DOSE REDUCTION: This exam was performed according to the departmental dose-optimization program which includes automated exposure control, adjustment of the mA and/or kV according to patient size and/or use of iterative  reconstruction technique. COMPARISON:  Head CT 02/05/2007. FINDINGS: Brain: No evidence of acute infarction, hemorrhage, hydrocephalus, extra-axial collection or mass lesion/mass effect. Vascular: No hyperdense vessel or unexpected calcification. Skull: No fracture or focal lesions are seen and no visible scalp hematoma. Sinuses/Orbits: Negative orbits. There is patchy opacification of the bilateral ethmoid air cells, mild membrane disease in the maxillary, ethmoid and frontal sinuses. There are no sinus fluid levels or intrasinus hemorrhage. The nasal septum deviates mildly to the left. The mastoid air cells and middle ears are clear. Other: None. IMPRESSION: No acute intracranial CT findings or depressed skull fractures. Sinus disease, greatest in the ethmoids. Electronically Signed   By: Almira Bar M.D.   On: 01/04/2023 20:14    Procedures .Marland KitchenLaceration Repair  Date/Time: 01/04/2023 9:06 PM  Performed by: Peter Garter, PA Authorized by: Peter Garter, PA   Consent:    Consent obtained:  Verbal   Consent given by:  Patient   Risks, benefits, and alternatives were discussed: yes     Risks discussed:  Need for additional repair, nerve damage, infection, pain, poor cosmetic result, poor wound healing, vascular damage, tendon damage and retained foreign body   Alternatives discussed:  Delayed treatment, no treatment, observation and referral Universal protocol:    Procedure explained and questions answered to patient or proxy's satisfaction: yes     Patient identity confirmed:  Verbally with patient Anesthesia:    Anesthesia method:  Local infiltration   Local anesthetic:  Lidocaine 1% WITH epi Laceration details:    Location:  Scalp   Scalp location:  Occipital   Length (cm):  2 Pre-procedure details:    Preparation:  Patient was prepped and draped in usual sterile fashion Exploration:    Limited defect created (wound extended): no     Hemostasis achieved with:  Direct  pressure   Imaging obtained: x-ray     Imaging outcome: foreign body not noted     Wound exploration: wound explored through full range of motion and entire depth of wound visualized     Contaminated: no   Treatment:    Area cleansed with:  Saline and chlorhexidine   Amount of cleaning:  Standard   Irrigation solution:  Sterile saline   Irrigation volume:  100   Irrigation method:  Syringe   Visualized foreign bodies/material removed: no     Debridement:  None   Undermining:  None   Scar revision: no   Skin repair:    Repair method:  Staples   Number of staples:  3 Approximation:    Approximation:  Close Repair type:    Repair type:  Simple Post-procedure details:    Dressing:  Open (no dressing)   Procedure completion:  Tolerated well, no immediate complications .Marland KitchenLaceration Repair  Date/Time: 01/04/2023 9:07 PM  Performed by: Peter Garter, PA Authorized by: Peter Garter, PA   Consent:    Consent obtained:  Verbal   Consent given by:  Patient   Risks, benefits, and alternatives were discussed: yes     Risks discussed:  Infection, poor wound healing, poor cosmetic result, pain, vascular damage, tendon damage, retained foreign body, need for additional repair and nerve damage   Alternatives discussed:  No treatment, delayed treatment, observation and referral Universal protocol:    Procedure explained and questions answered to patient or proxy's satisfaction: yes     Patient identity confirmed:  Verbally with patient Anesthesia:    Anesthesia method:  Local infiltration   Local anesthetic:  Lidocaine 1% WITH epi Laceration details:    Location:  Scalp   Scalp location:  Occipital   Length (cm):  1.5 Pre-procedure details:    Preparation:  Patient was prepped and draped in usual sterile fashion and imaging obtained to evaluate for foreign bodies Exploration:    Limited defect created (wound extended): no     Hemostasis achieved with:  Direct pressure    Imaging obtained: x-ray     Imaging outcome: foreign body not noted     Wound exploration: wound explored through full range of motion and entire depth of wound visualized     Contaminated: no   Treatment:    Area cleansed with:  Saline and chlorhexidine   Amount of cleaning:  Standard   Irrigation solution:  Sterile saline   Irrigation volume:  100   Irrigation method:  Syringe   Visualized foreign bodies/material removed: no     Debridement:  None   Undermining:  None   Scar revision: no   Skin repair:    Repair method:  Staples   Number of staples:  1 Approximation:    Approximation:  Close Repair type:    Repair type:  Simple Post-procedure details:    Dressing:  Open (no dressing)   Procedure completion:  Tolerated well, no immediate complications     Medications Ordered in ED Medications  lidocaine-EPINEPHrine (XYLOCAINE W/EPI) 2 %-1:200000 (PF) injection 10 mL (10 mLs Infiltration Given by Other 01/04/23 2041)    ED Course/ Medical Decision Making/ A&P                             Medical Decision Making Amount and/or Complexity of Data Reviewed Radiology: ordered.  Risk Prescription drug management.   This patient presents to the ED for concern of head injury, this involves an extensive number of treatment options, and is a complaint that carries with it a high risk of complications and morbidity.  The differential diagnosis includes concussion, CVA, fracture, laceration, foreign body   Co morbidities that complicate the patient evaluation  See HPI   Additional history obtained:  Additional history obtained from EMR External records from outside source obtained and reviewed including hospital records   Lab Tests:  N/a   Imaging Studies ordered:  I ordered imaging studies including CT head I independently visualized and interpreted imaging which showed no acute abnormalities.  Sinus disease I agree with the radiologist  interpretation   Cardiac Monitoring: / EKG:  The patient was maintained on a cardiac monitor.  I personally viewed and interpreted the cardiac monitored which showed an underlying rhythm of: Sinus rhythm   Consultations Obtained:  N/a  Problem List / ED Course / Critical interventions / Medication management  Head injury I ordered medication including lidocaine with epinephrine, let gel   Reevaluation of the patient after these medicines showed that the patient improved I have reviewed the patients home medicines and have made adjustments as needed   Social Determinants of Health:  Denies tobacco, illicit drug use   Test / Admission - Considered:  Head injury, laceration Vitals signs  within normal range and stable throughout visit. Imaging studies significant for: See above 22 year old male presents emergency department after head injury where he hit his head on bleachers.  Patient without any acute neurologic deficit but with 2 small lacerations noted on posterior scalp.  CT imaging was ordered in triage area which was negative for any acute intracranial abnormality.  Laceration repaired manner speak well.  Given overall clean apparent wounds, no comorbidities, antibiotics are not warranted in this case.  Recommend Tylenol/Motrin at home.  Patient educated regarding proper wound care for staples placed.  Recommend follow-up in 7 to 10 days for staple removal.  Treatment plan discussed at length with patient and he acknowledged understanding was agreeable to said plan. Worrisome signs and symptoms were discussed with the patient, and the patient acknowledged understanding to return to the ED if noticed. Patient was stable upon discharge.          Final Clinical Impression(s) / ED Diagnoses Final diagnoses:  Injury of head, initial encounter  Laceration of scalp, initial encounter    Rx / DC Orders ED Discharge Orders     None         Peter Garter,  [place] 01/04/23 2108    Wynetta Fines, MD 01/04/23 2212

## 2023-01-04 NOTE — ED Triage Notes (Signed)
Patient was running up and down bleachers for training and missed a step going down and fell backwards and hit the back of his head against the corner of the bleachers 2 hours ago. He did not pass out but he feels like his reaction time might be delayed a little bit. There was a lot of bleeding. He was able to control the bleeding mostly. He applied ice to the area as well.

## 2023-01-04 NOTE — ED Triage Notes (Addendum)
Pt arrives with c/o head injury after hitting his head on bleachers. Pt a&ox4. Per pt, he was sent from [facility] for CT scan. Pt endorses headache. Pt denies blurry vision.

## 2023-01-04 NOTE — Discharge Instructions (Signed)
Pt with head injury 3-3.5 hours ago. Two lacerations noted to occipital region of scalp on L. Pt filled out head injury form scoring: Moderate headache, mild balance problems, dizziness and fatigue, drowsiness, sensitivity to light and noise, nervousness and feeling more emotional.  Patient also notes numbness and tingling.  Since the injury patient reports moderate difficulty concentrating remembering and feeling slowed down. During our discussion, his headache became more severe, involving the entire head and not just the location of the laceration. Due to this, I feel CT scan may be indicated. Lacerations were not closed to ensure no interference with results of scan. Suspect after scan, staple closure will need to be obtained.  Pt to head to ER now for further evaluation of closed head injury.

## 2023-01-11 ENCOUNTER — Ambulatory Visit (HOSPITAL_COMMUNITY)
Admission: EM | Admit: 2023-01-11 | Discharge: 2023-01-11 | Disposition: A | Discharge status: Home / Self Care | Payer: No Typology Code available for payment source | Attending: Internal Medicine | Admitting: Internal Medicine

## 2023-01-11 DIAGNOSIS — Z4802 Encounter for removal of sutures: Secondary | ICD-10-CM

## 2023-01-11 NOTE — ED Triage Notes (Signed)
Pt reports he is here for removal of staples.

## 2023-01-11 NOTE — Discharge Instructions (Signed)
Your staples appear to feel the area well.  Please do not scrub at the area.  If needed you can use a topical antibacterial ointment.  Please return to clinic for any new or concerning symptoms, or any signs of infection.

## 2023-01-11 NOTE — ED Provider Notes (Signed)
[facility]    CSN: [id number] Arrival date & time: 01/11/23  1653      History   Chief Complaint Chief Complaint  Patient presents with   Suture / Staple Removal    HPI Jon Pham is a 22 y.o. male.   Patient presents to clinic for staple removal.  He had initially presented to the urgent care, was sent to the emergency department due to changes in mental status.  Had a total of 4 staples placed.  Denies any issues or concerns of infection.  The history is provided by the patient and medical records.  Suture / Staple Removal    Past Medical History:  Diagnosis Date   Asthma     There are no problems to display for this patient.   No past surgical history on file.     Home Medications    Prior to Admission medications   Medication Sig Start Date End Date Taking? Authorizing Provider  albuterol (VENTOLIN HFA) 108 (90 Base) MCG/ACT inhaler Inhale 2 puffs into the lungs every 4 (four) hours as needed for wheezing or shortness of breath. 12/10/22   Zenia Resides, MD    Family History Family History  Problem Relation Age of Onset   Cancer Other     Social History Social History   Tobacco Use   Smoking status: Never   Smokeless tobacco: Never  Substance Use Topics   Alcohol use: No   Drug use: No     Allergies   Patient has no known allergies.   Review of Systems Review of Systems   Physical Exam Triage Vital Signs ED Triage Vitals  Enc Vitals Group     BP 01/11/23 1710 122/78     Pulse Rate 01/11/23 1710 85     Resp 01/11/23 1710 15     Temp 01/11/23 1710 98.5 F (36.9 C)     Temp src --      SpO2 01/11/23 1710 98 %     Weight --      Height --      Head Circumference --      Peak Flow --      Pain Score 01/11/23 1712 0     Pain Loc --      Pain Edu? --      Excl. in GC? --    No data found.  Updated Vital Signs BP 122/78   Pulse 85   Temp 98.5 F (36.9 C)   Resp 15   SpO2 98%   Visual Acuity Right  Eye Distance:   Left Eye Distance:   Bilateral Distance:    Right Eye Near:   Left Eye Near:    Bilateral Near:     Physical Exam Vitals and nursing note reviewed.  HENT:     Head: Normocephalic.     Right Ear: External ear normal.     Left Ear: External ear normal.     Nose: Nose normal.     Mouth/Throat:     Mouth: Mucous membranes are moist.  Eyes:     Conjunctiva/sclera: Conjunctivae normal.  Cardiovascular:     Rate and Rhythm: Normal rate.  Pulmonary:     Effort: Pulmonary effort is normal. No respiratory distress.  Skin:    General: Skin is warm and dry.      UC Treatments / Results  Labs (all labs ordered are listed, but only abnormal results are displayed) Labs Reviewed - No data to  display  EKG   Radiology No results found.  Procedures Procedures (including critical care time)  Medications Ordered in UC Medications - No data to display  Initial Impression / Assessment and Plan / UC Course  I have reviewed the triage vital signs and the nursing notes.  Pertinent labs & imaging results that were available during my care of the patient were reviewed by me and considered in my medical decision making (see chart for details).  Vitals and triage reviewed, patient is hemodynamically stable.  4 sutures removed to posterior head.  Area appears to have healed well, without streaking, purulence, tenderness or bleeding.  Advised on wound care.  Patient verbalized understanding, return and follow-up precautions reviewed.     Final Clinical Impressions(s) / UC Diagnoses   Final diagnoses:  Encounter for staple removal     Discharge Instructions      Your staples appear to feel the area well.  Please do not scrub at the area.  If needed you can use a topical antibacterial ointment.  Please return to clinic for any new or concerning symptoms, or any signs of infection.    ED Prescriptions   None    PDMP not reviewed this encounter.   Brandee Markin,  [place], [place] 01/11/23 [phone]
